# Patient Record
Sex: Male | Born: 1990 | Race: Black or African American | Hispanic: No | Marital: Single | Smoking: Current some day smoker
Health system: Southern US, Community
[De-identification: ages and names within clinical notes are randomized; demographics above are authoritative.]

---
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
## 2010-03-11 ENCOUNTER — Emergency Department (HOSPITAL_COMMUNITY)
Admission: EM | Admit: 2010-03-11 | Discharge: 2010-03-11 | Payer: Self-pay | Source: Home / Self Care | Admitting: Family Medicine

## 2011-01-15 ENCOUNTER — Emergency Department (HOSPITAL_COMMUNITY)
Admission: EM | Admit: 2011-01-15 | Discharge: 2011-01-15 | Disposition: A | Discharge status: Home / Self Care | Payer: Self-pay | Attending: Emergency Medicine | Admitting: Emergency Medicine

## 2011-01-15 ENCOUNTER — Encounter: Payer: Self-pay | Admitting: Adult Health

## 2011-01-15 DIAGNOSIS — R51 Headache: Secondary | ICD-10-CM | POA: Insufficient documentation

## 2011-01-15 DIAGNOSIS — Y92410 Unspecified street and highway as the place of occurrence of the external cause: Secondary | ICD-10-CM | POA: Insufficient documentation

## 2011-01-15 DIAGNOSIS — T1490XA Injury, unspecified, initial encounter: Secondary | ICD-10-CM | POA: Insufficient documentation

## 2011-01-15 MED ORDER — CYCLOBENZAPRINE HCL 10 MG PO TABS: 10.0000 mg | ORAL_TABLET | Freq: Two times a day (BID) | ORAL | Status: AC | PRN

## 2011-01-15 MED ORDER — ACETAMINOPHEN 325 MG PO TABS
650.0000 mg | ORAL_TABLET | Freq: Once | ORAL | Status: AC
  Administered 2011-01-15: 650 mg via ORAL
  Filled 2011-01-15: qty 2

## 2011-01-15 MED ORDER — IBUPROFEN 800 MG PO TABS: 800.0000 mg | ORAL_TABLET | Freq: Three times a day (TID) | ORAL | Status: AC

## 2011-01-15 NOTE — ED Provider Notes (Signed)
Medical screening examination/treatment/procedure(s) were performed by non-physician practitioner and as supervising physician I was immediately available for consultation/collaboration.   Glynn Octave, MD 01/15/11 [phone]
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
## 2011-01-15 NOTE — ED Notes (Signed)
Rx given to pt. 
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
## 2013-06-04 ENCOUNTER — Encounter (HOSPITAL_COMMUNITY): Payer: Self-pay | Admitting: Emergency Medicine

## 2013-06-04 ENCOUNTER — Emergency Department (HOSPITAL_COMMUNITY)
Admission: EM | Admit: 2013-06-04 | Discharge: 2013-06-04 | Disposition: A | Discharge status: Home / Self Care | Payer: BC Managed Care – PPO | Attending: Emergency Medicine | Admitting: Emergency Medicine

## 2013-06-04 DIAGNOSIS — R369 Urethral discharge, unspecified: Secondary | ICD-10-CM

## 2013-06-04 DIAGNOSIS — F17200 Nicotine dependence, unspecified, uncomplicated: Secondary | ICD-10-CM | POA: Insufficient documentation

## 2013-06-04 DIAGNOSIS — R109 Unspecified abdominal pain: Secondary | ICD-10-CM | POA: Insufficient documentation

## 2013-06-04 MED ORDER — AZITHROMYCIN 250 MG PO TABS
1000.0000 mg | ORAL_TABLET | Freq: Once | ORAL | Status: AC
  Administered 2013-06-04: 1000 mg via ORAL
  Filled 2013-06-04: qty 4

## 2013-06-04 MED ORDER — STERILE WATER FOR INJECTION IJ SOLN
INTRAMUSCULAR | Status: AC
  Administered 2013-06-04: 2.1 mL
  Filled 2013-06-04: qty 10

## 2013-06-04 MED ORDER — CEFTRIAXONE SODIUM 250 MG IJ SOLR
250.0000 mg | Freq: Once | INTRAMUSCULAR | Status: AC
  Administered 2013-06-04: 250 mg via INTRAMUSCULAR
  Filled 2013-06-04: qty 250

## 2013-06-04 NOTE — ED Notes (Signed)
Pt reports getting call from Sunday from girlfriend that she was postive for chlamydia and that he needed to come to the ER to be tested and treated. Pt complaining of lower abdominal pain x 2 days. Denies penile discharge, odor or dysuria.

## 2013-06-04 NOTE — Discharge Instructions (Signed)
Refrain from sexual intercourse for 7 days. Be sure to have all partners tested and treated for STDs at the health department or your primary care provider.  Practice safe sex by always wearing condoms.

## 2013-06-04 NOTE — ED Provider Notes (Signed)
CSN: [id number]     Arrival date & time 06/04/13  [id number] History  This chart was scribed for non-physician practitioner Junius FinnerErin O'Malley, PA-C working with Dagmar HaitWilliam Blair Walden, MD by Joaquin MusicKristina Sanchez-Matthews, ED Scribe. This patient was seen in room TR06C/TR06C and the patient's care was started at 7:17 PM .   Chief Complaint  Patient presents with  . SEXUALLY TRANSMITTED DISEASE   The history is provided by the patient. No language interpreter was used.   HPI Comments: Scott Roach is a 23 y.o. male who presents to the Emergency Department complaining of ongoing penile discharge that began 1 week ago. He reports having received a phone call that he may have Chlamydia.  Pt states he is having flank and abdominal pain. He denies prior STD exposure. Pt denies dysuria, emesis, and nausea.  History reviewed. No pertinent past medical history. History reviewed. No pertinent past surgical history. History reviewed. No pertinent family history. History  Substance Use Topics  . Smoking status: Current Every Day Smoker  . Smokeless tobacco: Not on file  . Alcohol Use: Yes    Review of Systems  Gastrointestinal: Positive for abdominal pain. Negative for nausea and vomiting.  Genitourinary: Positive for flank pain and discharge. Negative for dysuria, hematuria, penile swelling and penile pain.    Allergies  Review of patient's allergies indicates no known allergies.  Home Medications   Prior to Admission medications   Not on File   BP 128/74  Pulse 53  Temp(Src) 98.6 F (37 C) (Oral)  Resp 18  Ht 6' (1.829 m)  Wt 210 lb (95.255 kg)  BMI 28.47 kg/m2  SpO2 100%  Physical Exam  Nursing note and vitals reviewed. Constitutional: He is oriented to person, place, and time. He appears well-developed and well-nourished.  HENT:  Head: Normocephalic and atraumatic.  Eyes: EOM are normal.  Neck: Normal range of motion.  Cardiovascular: Normal rate.   Pulmonary/Chest: Effort normal.   Genitourinary: Testes normal and penis normal. Right testis shows no mass, no swelling and no tenderness. Right testis is descended. Cremasteric reflex is not absent on the right side. Left testis shows no mass, no swelling and no tenderness. Left testis is descended. Cremasteric reflex is not absent on the left side. Circumcised. No penile erythema or penile tenderness. No discharge found.  Musculoskeletal: Normal range of motion.  Neurological: He is alert and oriented to person, place, and time.  Skin: Skin is warm and dry.  Psychiatric: He has a normal mood and affect. His behavior is normal.   ED Course  Procedures DIAGNOSTIC STUDIES: Oxygen Saturation is 100% on RA, normal by my interpretation.    COORDINATION OF CARE: 7:19 PM-Discussed treatment plan which includes STD swab. I served as a Secretary/administratorchaperon. Pt agreed to plan.   Examination chaperoned by Anabel BeneKristina A Sanchez-Matthews.  Labs Review Labs Reviewed  GC/CHLAMYDIA PROBE AMP - Abnormal; Notable for the following:    CT Probe RNA POSITIVE (*)    All other components within normal limits  RPR  HIV ANTIBODY (ROUTINE TESTING)    Imaging Review No results found.   EKG Interpretation None     MDM   Final diagnoses:  Discharge from penis    pt presenting to ED c/o penile discharge and exposure to STD. Pt tested with STD panel and empirically tx in ED. Advised to f/u with PCP and health department as needed for further STD testing and tx. Return precautions provided. Pt verbalized understanding and agreement with tx plan.  I personally performed the services described in this documentation, which was scribed in my presence. The recorded information has been reviewed and is accurate.    Junius FinnerErin O'Malley, PA-C 06/06/13 [phone]

## 2013-06-04 NOTE — ED Notes (Signed)
Per pt sts penile drainage that started 1 weeks ago. Wants to be checked for STD.

## 2013-06-05 LAB — GC/CHLAMYDIA PROBE AMP
CT Probe RNA: POSITIVE — AB
GC Probe RNA: NEGATIVE

## 2013-06-05 LAB — HIV ANTIBODY (ROUTINE TESTING W REFLEX): HIV 1&2 Ab, 4th Generation: NONREACTIVE

## 2013-06-05 LAB — RPR

## 2013-06-06 ENCOUNTER — Telehealth (HOSPITAL_BASED_OUTPATIENT_CLINIC_OR_DEPARTMENT_OTHER): Payer: Self-pay | Admitting: Emergency Medicine

## 2013-06-06 NOTE — Telephone Encounter (Signed)
+  Chlamydia. Patient treated with Rocephin and Zithromax. [facility] faxed. 

## 2013-06-07 NOTE — ED Provider Notes (Signed)
Medical screening examination/treatment/procedure(s) were performed by non-physician practitioner and as supervising physician I was immediately available for consultation/collaboration.   EKG Interpretation None        Dagmar HaitWilliam Abbye Lao, MD 06/07/13 [phone]

## 2013-08-09 ENCOUNTER — Telehealth (HOSPITAL_BASED_OUTPATIENT_CLINIC_OR_DEPARTMENT_OTHER): Payer: Self-pay | Admitting: Emergency Medicine

## 2013-08-26 ENCOUNTER — Encounter (HOSPITAL_COMMUNITY): Payer: Self-pay | Admitting: Emergency Medicine

## 2013-08-26 DIAGNOSIS — Z202 Contact with and (suspected) exposure to infections with a predominantly sexual mode of transmission: Secondary | ICD-10-CM | POA: Diagnosis not present

## 2013-08-26 DIAGNOSIS — F17200 Nicotine dependence, unspecified, uncomplicated: Secondary | ICD-10-CM | POA: Insufficient documentation

## 2013-08-26 DIAGNOSIS — R109 Unspecified abdominal pain: Secondary | ICD-10-CM | POA: Diagnosis present

## 2013-08-26 NOTE — ED Notes (Signed)
Patient here with complaint of left pelvic pain and penile discharge which started about 2 weeks ago. Seen and treated for similar issue in April. Completed course of treatment for STD at that time.

## 2013-08-27 ENCOUNTER — Emergency Department (HOSPITAL_COMMUNITY)
Admission: EM | Admit: 2013-08-27 | Discharge: 2013-08-27 | Disposition: A | Discharge status: Home / Self Care | Payer: BC Managed Care – PPO | Attending: Emergency Medicine | Admitting: Emergency Medicine

## 2013-08-27 DIAGNOSIS — Z202 Contact with and (suspected) exposure to infections with a predominantly sexual mode of transmission: Secondary | ICD-10-CM

## 2013-08-27 LAB — URINALYSIS, ROUTINE W REFLEX MICROSCOPIC
Bilirubin Urine: NEGATIVE
GLUCOSE, UA: NEGATIVE mg/dL
Hgb urine dipstick: NEGATIVE
KETONES UR: NEGATIVE mg/dL
Nitrite: NEGATIVE
PH: 6 (ref 5.0–8.0)
Protein, ur: NEGATIVE mg/dL
Specific Gravity, Urine: 1.034 — ABNORMAL HIGH (ref 1.005–1.030)
Urobilinogen, UA: 1 mg/dL (ref 0.0–1.0)

## 2013-08-27 LAB — HIV ANTIBODY (ROUTINE TESTING W REFLEX): HIV: NONREACTIVE

## 2013-08-27 LAB — URINE MICROSCOPIC-ADD ON

## 2013-08-27 LAB — RPR

## 2013-08-27 MED ORDER — STERILE WATER FOR INJECTION IJ SOLN
INTRAMUSCULAR | Status: AC
  Filled 2013-08-27: qty 10

## 2013-08-27 MED ORDER — METRONIDAZOLE 500 MG PO TABS
2000.0000 mg | ORAL_TABLET | Freq: Once | ORAL | Status: AC
  Administered 2013-08-27: 2000 mg via ORAL
  Filled 2013-08-27: qty 4

## 2013-08-27 MED ORDER — AZITHROMYCIN 250 MG PO TABS
1000.0000 mg | ORAL_TABLET | Freq: Once | ORAL | Status: AC
  Administered 2013-08-27: 1000 mg via ORAL
  Filled 2013-08-27: qty 4

## 2013-08-27 MED ORDER — CEFTRIAXONE SODIUM 250 MG IJ SOLR
250.0000 mg | Freq: Once | INTRAMUSCULAR | Status: AC
  Administered 2013-08-27: 250 mg via INTRAMUSCULAR
  Filled 2013-08-27: qty 250

## 2013-08-27 NOTE — Discharge Instructions (Signed)
Please followup with the [facility] sexually transmitted disease department for continued evaluation and treatment.    Sexually Transmitted Disease A sexually transmitted disease (STD) is a disease or infection that may be passed (transmitted) from person to person, usually during sexual activity. This may happen by way of saliva, semen, blood, vaginal mucus, or urine. Common STDs include:   Gonorrhea.   Chlamydia.   Syphilis.   HIV and AIDS.   Genital herpes.   Hepatitis B and C.   Trichomonas.   Human papillomavirus (HPV).   Pubic lice.   Scabies.  Mites.  Bacterial vaginosis. WHAT ARE CAUSES OF STDs? An STD may be caused by bacteria, a virus, or parasites. STDs are often transmitted during sexual activity if one person is infected. However, they may also be transmitted through nonsexual means. STDs may be transmitted after:   Sexual intercourse with an infected person.   Sharing sex toys with an infected person.   Sharing needles with an infected person or using unclean piercing or tattoo needles.  Having intimate contact with the genitals, mouth, or rectal areas of an infected person.   Exposure to infected fluids during birth. WHAT ARE THE SIGNS AND SYMPTOMS OF STDs? Different STDs have different symptoms. Some people may not have any symptoms. If symptoms are present, they may include:   Painful or bloody urination.   Pain in the pelvis, abdomen, vagina, anus, throat, or eyes.   A skin rash, itching, or irritation.  Growths, ulcerations, blisters, or sores in the genital and anal areas.  Abnormal vaginal discharge with or without bad odor.   Penile discharge in men.   Fever.   Pain or bleeding during sexual intercourse.   Swollen glands in the groin area.   Yellow skin and eyes (jaundice). This is seen with hepatitis.   Swollen testicles.  Infertility.  Sores and blisters in the mouth. HOW ARE STDs  DIAGNOSED? To make a diagnosis, your health care provider may:   Take a medical history.   Perform a physical exam.   Take a sample of any discharge to examine.  Swab the throat, cervix, opening to the penis, rectum, or vagina for testing.  Test a sample of your first morning urine.   Perform blood tests.   Perform a Pap test, if this applies.   Perform a colposcopy.   Perform a laparoscopy.  HOW ARE STDs TREATED? Treatment depends on the STD. Some STDs may be treated but not cured.   Chlamydia, gonorrhea, trichomonas, and syphilis can be cured with antibiotic medicine.   Genital herpes, hepatitis, and HIV can be treated, but not cured, with prescribed medicines. The medicines lessen symptoms.   Genital warts from HPV can be treated with medicine or by freezing, burning (electrocautery), or surgery. Warts may come back.   HPV cannot be cured with medicine or surgery. However, abnormal areas may be removed from the cervix, vagina, or vulva.   If your diagnosis is confirmed, your recent sexual partners need treatment. This is true even if they are symptom-free or have a negative culture or evaluation. They should not have sex until their health care providers say it is okay. HOW CAN I REDUCE MY RISK OF GETTING AN STD? Take these steps to reduce your risk of getting an STD:  Use latex condoms, dental dams, and water-soluble lubricants during sexual activity. Do not use petroleum jelly or oils.  Avoid having multiple sex partners.  Do not have sex with someone who  has other sex partners.  Do not have sex with anyone you do not know or who is at high risk for an STD.  Avoid risky sex practices that can break your skin.  Do not have sex if you have open sores on your mouth or skin.  Avoid drinking too much alcohol or taking illegal drugs. Alcohol and drugs can affect your judgment and put you in a vulnerable position.  Avoid engaging in oral and anal sex  acts.  Get vaccinated for HPV and hepatitis. If you have not received these vaccines in the past, talk to your health care provider about whether one or both might be right for you.   If you are at risk of being infected with HIV, it is recommended that you take a prescription medicine daily to prevent HIV infection. This is called pre-exposure prophylaxis (PrEP). You are considered at risk if:  You are a man who has sex with other men (MSM).  You are a heterosexual man or woman and are sexually active with more than one partner.  You take drugs by injection.  You are sexually active with a partner who has HIV.  Talk with your health care provider about whether you are at high risk of being infected with HIV. If you choose to begin PrEP, you should first be tested for HIV. You should then be tested every 3 months for as long as you are taking PrEP.  WHAT SHOULD I DO IF I THINK I HAVE AN STD?  See your health care provider.   Tell your sexual partner(s). They should be tested and treated for any STDs.  Do not have sex until your health care provider says it is okay. WHEN SHOULD I GET IMMEDIATE MEDICAL CARE? Contact your health care provider right away if:   You have severe abdominal pain.  You are a man and notice swelling or pain in your testicles.  You are a woman and notice swelling or pain in your vagina. Document Released: 04/24/2002 Document Revised: 02/06/2013 Document Reviewed: 08/22/2012 [facility] Patient Information 2015 [place], [place]. This information is not intended to replace advice given to you by your health care provider. Make sure you discuss any questions you have with your health care provider.

## 2013-08-27 NOTE — ED Notes (Signed)
Pt complaining of left groin pain x 3 weeks. States sex partner informed him at that time she possibly had a STD and that he should get tested and treated for one, as well.

## 2013-08-27 NOTE — ED Notes (Signed)
Administer IM shot. Will hold pt for 30 minutes to ensure no allergic reaction

## 2013-08-27 NOTE — ED Provider Notes (Signed)
Medical screening examination/treatment/procedure(s) were performed by non-physician practitioner and as supervising physician I was immediately available for consultation/collaboration.   EKG Interpretation None       Olivia Mackielga M Amber Guthridge, MD 08/27/13 [phone]

## 2013-08-27 NOTE — ED Provider Notes (Signed)
CSN: [id number]     Arrival date & time 08/26/13  2314 History   First MD Initiated Contact with Patient 08/27/13 0020     Chief Complaint  Patient presents with  . Pelvic Pain   HPI  History provided by the patient. Patient is a 23 year old male presenting with concerns for possible STD he states that his girlfriend came to ED with STD symptoms and she asked him to come and get treatment. He denies having any dysuria, hematuria or urinary frequency. Does state that he has had slight left inguinal discomforts and noticed a slight penile discharge. Denies any rash to the skin. No fever, chills or sweats. No testicle pain or swelling.     History reviewed. No pertinent past medical history. History reviewed. No pertinent past surgical history. No family history on file. History  Substance Use Topics  . Smoking status: Current Some Day Smoker -- 0.25 packs/day    Types: Cigarettes  . Smokeless tobacco: Not on file  . Alcohol Use: Yes     Comment: occasional    Review of Systems  Constitutional: Negative for fever, chills and diaphoresis.  Gastrointestinal: Positive for abdominal pain. Negative for nausea, vomiting, diarrhea and constipation.  Genitourinary: Negative for dysuria, frequency, hematuria, discharge, penile swelling, scrotal swelling, penile pain and testicular pain.  All other systems reviewed and are negative.     Allergies  Review of patient's allergies indicates no known allergies.  Home Medications   Prior to Admission medications   Not on File   Pulse 78  Temp(Src) 97.8 F (36.6 C) (Oral)  Ht 6' (1.829 m)  Wt 214 lb 4.8 oz (97.206 kg)  BMI 29.06 kg/m2  SpO2 100% Physical Exam  Nursing note and vitals reviewed. Constitutional: He is oriented to person, place, and time. He appears well-developed and well-nourished.  HENT:  Head: Normocephalic.  Cardiovascular: Normal rate and regular rhythm.   Pulmonary/Chest: Effort normal and breath sounds normal.   Abdominal: Soft. There is no tenderness. There is no rebound and no guarding. Hernia confirmed negative in the right inguinal area and confirmed negative in the left inguinal area.  Genitourinary: Testes normal and penis normal. Right testis shows no mass and no tenderness. Left testis shows no tenderness. Circumcised. No penile tenderness. No discharge found.  There is slight enlargement of bilateral inguinal lymph nodes. No tenderness. No rash of the skin. No penile discharge. No testicle or scrotal tenderness. No swelling.  Lymphadenopathy:       Right: Inguinal adenopathy present.       Left: Inguinal adenopathy present.  Neurological: He is alert and oriented to person, place, and time.  Skin: Skin is warm.  Psychiatric: He has a normal mood and affect. His behavior is normal.    ED Course  Procedures   COORDINATION OF CARE:  Nursing notes reviewed. Vital signs reviewed. Initial pt interview and examination performed.   Filed Vitals:   08/26/13 2319  Pulse: 78  Temp: 97.8 F (36.6 C)  TempSrc: Oral  Height: 6' (1.829 m)  Weight: 214 lb 4.8 oz (97.206 kg)  SpO2: 100%    12:30 AM-patient seen and evaluated. Patient with significant WBCs on UA and concern for possible STD. Will treat for possible gonorrhea, Chlamydia and Trichomonas. [facility]    Results for orders placed during the hospital encounter of 08/27/13  URINALYSIS, ROUTINE W REFLEX MICROSCOPIC      Result Value Ref Range  Color, Urine YELLOW  YELLOW   APPearance CLEAR  CLEAR   Specific Gravity, Urine 1.034 (*) 1.005 - 1.030   pH 6.0  5.0 - 8.0   Glucose, UA NEGATIVE  NEGATIVE mg/dL   Hgb urine dipstick NEGATIVE  NEGATIVE   Bilirubin Urine NEGATIVE  NEGATIVE   Ketones, ur NEGATIVE  NEGATIVE mg/dL   Protein, ur NEGATIVE  NEGATIVE mg/dL   Urobilinogen, UA 1.0  0.0 - 1.0 mg/dL   Nitrite NEGATIVE  NEGATIVE   Leukocytes, UA SMALL (*) NEGATIVE  URINE  MICROSCOPIC-ADD ON      Result Value Ref Range   Squamous Epithelial / LPF FEW (*) RARE   WBC, UA 21-50  <3 WBC/hpf   Bacteria, UA RARE  RARE   Crystals CA OXALATE CRYSTALS (*) NEGATIVE     MDM   Final diagnoses:  Possible exposure to STD        Angus Seller, PA-C 08/27/13 [phone]

## 2013-08-28 LAB — GC/CHLAMYDIA PROBE AMP
CT Probe RNA: NEGATIVE
GC Probe RNA: NEGATIVE
